# Patient Record
Sex: Female | Born: 1995
Health system: Southern US, Community
[De-identification: ages and names within clinical notes are randomized; demographics above are authoritative.]

---
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
## 2015-05-21 ENCOUNTER — Emergency Department (HOSPITAL_COMMUNITY)
Admission: EM | Admit: 2015-05-21 | Discharge: 2015-05-21 | Disposition: A | Discharge status: Home / Self Care | Payer: Self-pay | Attending: Emergency Medicine | Admitting: Emergency Medicine

## 2015-05-21 ENCOUNTER — Encounter (HOSPITAL_COMMUNITY): Payer: Self-pay | Admitting: Emergency Medicine

## 2015-05-21 DIAGNOSIS — H109 Unspecified conjunctivitis: Secondary | ICD-10-CM

## 2015-05-21 MED ORDER — ERYTHROMYCIN 5 MG/GM OP OINT: TOPICAL_OINTMENT | OPHTHALMIC | Status: AC

## 2015-05-21 MED ORDER — TETRACAINE HCL 0.5 % OP SOLN
2.0000 [drp] | Freq: Once | OPHTHALMIC | Status: AC
  Administered 2015-05-21: 2 [drp] via OPHTHALMIC
  Filled 2015-05-21: qty 2

## 2015-05-21 MED ORDER — FLUORESCEIN SODIUM 1 MG OP STRP
1.0000 | ORAL_STRIP | Freq: Once | OPHTHALMIC | Status: AC
  Administered 2015-05-21: 1 via OPHTHALMIC
  Filled 2015-05-21: qty 1

## 2015-05-21 NOTE — Discharge Instructions (Signed)
Apply a bead of antibiotic ointment to your lower eyelid every 6 hrs while awake for 5 days.  Discontinue use after 3 days if no improvement and follow up with eye specialist.    Conjunctivitis Conjunctivitis is commonly called "pink eye." Conjunctivitis can be caused by bacterial or viral infection, allergies, or injuries. There is usually redness of the lining of the eye, itching, discomfort, and sometimes discharge. There may be deposits of matter along the eyelids. A viral infection usually causes a watery discharge, while a bacterial infection causes a yellowish, thick discharge. Pink eye is very contagious and spreads by direct contact. You may be given antibiotic eyedrops as part of your treatment. Before using your eye medicine, remove all drainage from the eye by washing gently with warm water and cotton balls. Continue to use the medication until you have awakened 2 mornings in a row without discharge from the eye. Do not rub your eye. This increases the irritation and helps spread infection. Use separate towels from other household members. Wash your hands with soap and water before and after touching your eyes. Use cold compresses to reduce pain and sunglasses to relieve irritation from light. Do not wear contact lenses or wear eye makeup until the infection is gone. SEEK MEDICAL CARE IF:   Your symptoms are not better after 3 days of treatment.  You have increased pain or trouble seeing.  The outer eyelids become very red or swollen. Document Released: 01/10/2005 Document Revised: 02/25/2012 Document Reviewed: 12/03/2005 [facility] Patient Information 2015 [employer], [place]. This information is not intended to replace advice given to you by your health care provider. Make sure you discuss any questions you have with your health care provider.

## 2015-05-21 NOTE — ED Provider Notes (Signed)
CSN: [id number]     Arrival date & time 05/21/15  1049 History  This chart was scribed for non-physician practitioner, Fayrene Helper, PA-C working with Eber Hong, MD by Doreatha Martin, ED scribe. This patient was seen in room WTR5/WTR5 and the patient's care was started at 12:05 PM  Chief Complaint  Patient presents with  . Eye Drainage   The history is provided by the patient. No language interpreter was used.    HPI Comments: Holly Shannon is a 19 y.o. female who presents to the Emergency Department complaining of yellow discharge on the left eye onset 2 days PTA. She notes crusting over the eye in the morning, erythema and itching as associated Sx. She also notes an episode of mild blurry vision yesterday. Pt states she had a recent episode of pink eye in April and was given Cipro. She notes that the discharge started out white and has recently become yellow. The pt works at [employer]. Pt has tried OTC pink eye treatment with moderate relief.  She denies Hx of sickle cell, recent contact lens use and sick contact. She also denies eye pain, rhinorrhea, fever, chills, cough, double vision, loss of vision, and otalgia.    History reviewed. No pertinent past medical history. History reviewed. No pertinent past surgical history. History reviewed. No pertinent family history. History  Substance Use Topics  . Smoking status: Never Smoker   . Smokeless tobacco: Not on file  . Alcohol Use: No   OB History    No data available     Review of Systems  Constitutional: Negative for fever and chills.  HENT: Negative for ear pain and rhinorrhea.   Eyes: Positive for redness and itching.  Respiratory: Negative for cough.       Allergies  Review of patient's allergies indicates no known allergies.  Home Medications   Prior to Admission medications   Not on File   BP 103/69 mmHg  Pulse 99  Temp(Src) 98.1 F (36.7 C) (Oral)  Resp 17  SpO2 99% Physical Exam  Constitutional: She is  oriented to person, place, and time. She appears well-developed and well-nourished. No distress.  HENT:  Head: Normocephalic and atraumatic.  Right Ear: External ear normal.  Left Ear: External ear normal.  Mouth/Throat: Oropharynx is clear and moist.  No changes in visual acuity. TM intact. No sinus pain. No adenopathy. Uvula midline.    Eyes: EOM are normal. Pupils are equal, round, and reactive to light.  Slit lamp exam:      The left eye shows no corneal abrasion, no corneal flare, no corneal ulcer, no foreign body, no hyphema, no hypopyon and no fluorescein uptake.  Left eye conjunctiva mild injection. Limbic sparing. Lids everted, No foreign object. No evidence of chalazion or stye.   R eye with normal appearance  Visual Acuity - Bilateral Distance: 20/20 ; R Distance: 20/20 ; L Distance: 20/15  Neck: Normal range of motion. Neck supple. No tracheal deviation present.  Cardiovascular: Normal rate, regular rhythm and normal heart sounds.  Exam reveals no gallop and no friction rub.   No murmur heard. Pulmonary/Chest: Effort normal and breath sounds normal. No respiratory distress.  Abdominal: Soft. She exhibits no distension. There is no tenderness. There is no rebound and no guarding.  Musculoskeletal: Normal range of motion. She exhibits no edema or tenderness.  Neurological: She is alert and oriented to person, place, and time.  Skin: Skin is warm and dry.  Psychiatric: She has a  normal mood and affect. Her behavior is normal.  Nursing note and vitals reviewed.   ED Course  Procedures (including critical care time) DIAGNOSTIC STUDIES: Oxygen Saturation is 99% on RA, normal by my interpretation.    COORDINATION OF CARE: 12:13 PM Discussed treatment plan with pt at bedside which includes  and pt agreed to plan.   Patient with mild injected left eye. No significant finding on exam concerning for ocular emergency. Suspect viral etiology however I will provide erythromycin  ointment to use if symptoms worsen. Her visual acuity is normal. Patient can follow-up with ophthalmology as needed. She did report using contact lens in the past but has not been using for the past month. I do not see any evidence of corneal ulcerations or dendritic lesion on exam.  Labs Review Labs Reviewed - No data to display  Imaging Review No results found.   EKG Interpretation None      MDM   Final diagnoses:  Conjunctivitis, left eye    BP 103/69 mmHg  Pulse 99  Temp(Src) 98.1 F (36.7 C) (Oral)  Resp 17  SpO2 99%  I personally performed the services described in this documentation, which was scribed in my presence. The recorded information has been reviewed and is accurate.    Fayrene HelperBowie Nina Hoar, PA-C 05/21/15 1258  Eber HongBrian Miller, MD 05/21/15 [phone]

## 2015-05-21 NOTE — ED Notes (Signed)
Pt reports L eye redness and purulent drainage.

## 2015-05-30 ENCOUNTER — Ambulatory Visit: Payer: Self-pay

## 2019-12-21 DIAGNOSIS — Z0000 Encounter for general adult medical examination without abnormal findings: Secondary | ICD-10-CM | POA: Diagnosis not present

## 2019-12-21 DIAGNOSIS — J45990 Exercise induced bronchospasm: Secondary | ICD-10-CM | POA: Diagnosis not present

## 2019-12-21 DIAGNOSIS — Z3041 Encounter for surveillance of contraceptive pills: Secondary | ICD-10-CM | POA: Diagnosis not present

## 2020-03-29 DIAGNOSIS — J301 Allergic rhinitis due to pollen: Secondary | ICD-10-CM | POA: Diagnosis not present

## 2020-03-29 DIAGNOSIS — J3489 Other specified disorders of nose and nasal sinuses: Secondary | ICD-10-CM | POA: Diagnosis not present

## 2020-05-14 ENCOUNTER — Ambulatory Visit: Payer: Self-pay | Attending: Internal Medicine

## 2020-06-30 DIAGNOSIS — F418 Other specified anxiety disorders: Secondary | ICD-10-CM | POA: Diagnosis not present

## 2020-06-30 MED FILL — busPIRone HCL 15 MG TABS: 15 | 30 days supply | Qty: 60 | Fill #0

## 2021-03-09 DIAGNOSIS — Z130 Encounter for screening for diseases of the blood and blood-forming organs and certain disorders involving the immune mechanism: Secondary | ICD-10-CM | POA: Diagnosis not present

## 2021-03-09 DIAGNOSIS — Z0000 Encounter for general adult medical examination without abnormal findings: Secondary | ICD-10-CM | POA: Diagnosis not present

## 2021-03-09 DIAGNOSIS — Z13220 Encounter for screening for lipoid disorders: Secondary | ICD-10-CM | POA: Diagnosis not present

## 2021-03-09 DIAGNOSIS — J45990 Exercise induced bronchospasm: Secondary | ICD-10-CM | POA: Diagnosis not present

## 2021-03-09 DIAGNOSIS — F418 Other specified anxiety disorders: Secondary | ICD-10-CM | POA: Diagnosis not present

## 2021-03-09 DIAGNOSIS — Z1389 Encounter for screening for other disorder: Secondary | ICD-10-CM | POA: Diagnosis not present

## 2021-03-09 DIAGNOSIS — Z01419 Encounter for gynecological examination (general) (routine) without abnormal findings: Secondary | ICD-10-CM | POA: Diagnosis not present

## 2021-03-09 DIAGNOSIS — Z1329 Encounter for screening for other suspected endocrine disorder: Secondary | ICD-10-CM | POA: Diagnosis not present

## 2021-03-09 DIAGNOSIS — Z13228 Encounter for screening for other metabolic disorders: Secondary | ICD-10-CM | POA: Diagnosis not present

## 2021-03-16 ENCOUNTER — Other Ambulatory Visit (HOSPITAL_COMMUNITY): Payer: Self-pay | Admitting: Physician Assistant

## 2021-03-16 MED FILL — FLUCONAZOLE 150 MG TABS: 150 | 5 days supply | Qty: 2 | Fill #0

## 2022-01-18 ENCOUNTER — Other Ambulatory Visit (HOSPITAL_COMMUNITY): Payer: Self-pay

## 2022-01-18 MED ORDER — LEVONORGESTREL-ETHINYL ESTRAD 0.1-20 MG-MCG PO TABS
ORAL_TABLET | ORAL | 5 refills | Status: AC
  Filled 2022-01-18: qty 84, 84d supply, fill #0

## 2022-01-20 ENCOUNTER — Other Ambulatory Visit (HOSPITAL_COMMUNITY): Payer: Self-pay

## 2022-04-16 ENCOUNTER — Other Ambulatory Visit (HOSPITAL_COMMUNITY): Payer: Self-pay

## 2022-04-16 MED ORDER — ALBUTEROL SULFATE HFA 108 (90 BASE) MCG/ACT IN AERS
INHALATION_SPRAY | RESPIRATORY_TRACT | 1 refills | Status: AC
  Filled 2022-04-16: qty 6.7, 25d supply, fill #0

## 2022-04-16 MED ORDER — LEVONORGESTREL-ETHINYL ESTRAD 0.1-20 MG-MCG PO TABS
ORAL_TABLET | ORAL | 5 refills | Status: AC
  Filled 2022-04-16: qty 84, 84d supply, fill #0
  Filled 2022-07-04: qty 84, 84d supply, fill #1
  Filled 2022-08-23: qty 84, 84d supply, fill #2

## 2022-07-04 ENCOUNTER — Other Ambulatory Visit (HOSPITAL_COMMUNITY): Payer: Self-pay

## 2022-08-23 ENCOUNTER — Other Ambulatory Visit (HOSPITAL_COMMUNITY): Payer: Self-pay

## 2022-12-31 ENCOUNTER — Other Ambulatory Visit (HOSPITAL_COMMUNITY): Payer: Self-pay

## 2022-12-31 MED ORDER — ALBUTEROL SULFATE HFA 108 (90 BASE) MCG/ACT IN AERS
2.0000 | INHALATION_SPRAY | Freq: Four times a day (QID) | RESPIRATORY_TRACT | 1 refills | Status: AC | PRN
  Filled 2022-12-31: qty 6.7, 25d supply, fill #0
  Filled 2023-03-15: qty 6.7, 17d supply, fill #0

## 2022-12-31 MED ORDER — LEVONORGESTREL-ETHINYL ESTRAD 0.1-20 MG-MCG PO TABS
1.0000 | ORAL_TABLET | Freq: Every day | ORAL | 5 refills | Status: AC
  Filled 2022-12-31: qty 84, 84d supply, fill #0

## 2023-01-10 ENCOUNTER — Other Ambulatory Visit (HOSPITAL_COMMUNITY): Payer: Self-pay

## 2023-03-15 ENCOUNTER — Other Ambulatory Visit (HOSPITAL_COMMUNITY): Payer: Self-pay

## 2023-03-26 ENCOUNTER — Other Ambulatory Visit (HOSPITAL_COMMUNITY): Payer: Self-pay

## 2023-03-26 MED ORDER — LEVONORGESTREL-ETHINYL ESTRAD 0.1-20 MG-MCG PO TABS
1.0000 | ORAL_TABLET | Freq: Every day | ORAL | 5 refills | Status: AC
  Filled 2023-03-26: qty 84, 84d supply, fill #0

## 2023-04-18 ENCOUNTER — Other Ambulatory Visit (HOSPITAL_COMMUNITY): Payer: Self-pay

## 2023-04-18 DIAGNOSIS — Z0000 Encounter for general adult medical examination without abnormal findings: Secondary | ICD-10-CM | POA: Diagnosis not present

## 2023-04-18 DIAGNOSIS — Z87898 Personal history of other specified conditions: Secondary | ICD-10-CM | POA: Diagnosis not present

## 2023-04-18 DIAGNOSIS — Z130 Encounter for screening for diseases of the blood and blood-forming organs and certain disorders involving the immune mechanism: Secondary | ICD-10-CM | POA: Diagnosis not present

## 2023-04-18 DIAGNOSIS — F418 Other specified anxiety disorders: Secondary | ICD-10-CM | POA: Diagnosis not present

## 2023-04-18 DIAGNOSIS — J45990 Exercise induced bronchospasm: Secondary | ICD-10-CM | POA: Diagnosis not present

## 2023-04-18 DIAGNOSIS — Z13220 Encounter for screening for lipoid disorders: Secondary | ICD-10-CM | POA: Diagnosis not present

## 2023-04-18 DIAGNOSIS — Z3041 Encounter for surveillance of contraceptive pills: Secondary | ICD-10-CM | POA: Diagnosis not present

## 2023-04-18 DIAGNOSIS — Z13228 Encounter for screening for other metabolic disorders: Secondary | ICD-10-CM | POA: Diagnosis not present

## 2023-04-18 DIAGNOSIS — Z1329 Encounter for screening for other suspected endocrine disorder: Secondary | ICD-10-CM | POA: Diagnosis not present

## 2023-04-18 DIAGNOSIS — Z1331 Encounter for screening for depression: Secondary | ICD-10-CM | POA: Diagnosis not present

## 2023-04-18 MED ORDER — ONDANSETRON 4 MG PO TBDP
4.0000 mg | ORAL_TABLET | Freq: Three times a day (TID) | ORAL | 0 refills | Status: AC | PRN
  Filled 2023-04-18 – 2023-06-18 (×2): qty 20, 7d supply, fill #0

## 2023-04-18 MED ORDER — ALBUTEROL SULFATE HFA 108 (90 BASE) MCG/ACT IN AERS
2.0000 | INHALATION_SPRAY | Freq: Four times a day (QID) | RESPIRATORY_TRACT | 1 refills | Status: AC | PRN
  Filled 2023-04-18: qty 6.7, 25d supply, fill #0

## 2023-04-18 MED ORDER — SCOPOLAMINE 1 MG/3DAYS TD PT72
MEDICATED_PATCH | TRANSDERMAL | 1 refills | Status: AC
  Filled 2023-04-18 – 2023-06-18 (×2): qty 4, 12d supply, fill #0

## 2023-04-26 ENCOUNTER — Other Ambulatory Visit (HOSPITAL_COMMUNITY): Payer: Self-pay

## 2023-06-18 ENCOUNTER — Other Ambulatory Visit (HOSPITAL_COMMUNITY): Payer: Self-pay

## 2023-06-18 MED ORDER — LEVONORGESTREL-ETHINYL ESTRAD 0.1-20 MG-MCG PO TABS
1.0000 | ORAL_TABLET | Freq: Every day | ORAL | 5 refills | Status: AC
  Filled 2023-06-18: qty 84, 84d supply, fill #0

## 2023-08-05 DIAGNOSIS — Z13228 Encounter for screening for other metabolic disorders: Secondary | ICD-10-CM | POA: Diagnosis not present

## 2023-08-05 DIAGNOSIS — Z1329 Encounter for screening for other suspected endocrine disorder: Secondary | ICD-10-CM | POA: Diagnosis not present

## 2023-08-05 DIAGNOSIS — Z111 Encounter for screening for respiratory tuberculosis: Secondary | ICD-10-CM | POA: Diagnosis not present

## 2023-08-05 DIAGNOSIS — Z87898 Personal history of other specified conditions: Secondary | ICD-10-CM | POA: Diagnosis not present

## 2023-08-05 DIAGNOSIS — Z0000 Encounter for general adult medical examination without abnormal findings: Secondary | ICD-10-CM | POA: Diagnosis not present

## 2023-08-05 DIAGNOSIS — Z130 Encounter for screening for diseases of the blood and blood-forming organs and certain disorders involving the immune mechanism: Secondary | ICD-10-CM | POA: Diagnosis not present

## 2023-08-05 DIAGNOSIS — Z13220 Encounter for screening for lipoid disorders: Secondary | ICD-10-CM | POA: Diagnosis not present

## 2023-09-20 ENCOUNTER — Other Ambulatory Visit (HOSPITAL_COMMUNITY): Payer: Self-pay

## 2023-09-20 MED ORDER — LEVONORGESTREL-ETHINYL ESTRAD 0.1-20 MG-MCG PO TABS
ORAL_TABLET | ORAL | 5 refills | Status: AC
  Filled 2023-09-20: qty 84, 84d supply, fill #0

## 2023-09-20 MED ORDER — ALBUTEROL SULFATE HFA 108 (90 BASE) MCG/ACT IN AERS
INHALATION_SPRAY | RESPIRATORY_TRACT | 1 refills | Status: AC
  Filled 2023-09-20: qty 20.1, 75d supply, fill #0
  Filled 2023-09-20: qty 6.7, 20d supply, fill #0

## 2023-09-26 ENCOUNTER — Other Ambulatory Visit (HOSPITAL_COMMUNITY): Payer: Self-pay

## 2023-12-27 ENCOUNTER — Other Ambulatory Visit (HOSPITAL_COMMUNITY): Payer: Self-pay

## 2023-12-27 ENCOUNTER — Other Ambulatory Visit: Payer: Self-pay

## 2023-12-27 MED ORDER — LEVONORGESTREL-ETHINYL ESTRAD 0.1-20 MG-MCG PO TABS
1.0000 | ORAL_TABLET | Freq: Every day | ORAL | 1 refills | Status: AC
  Filled 2023-12-27: qty 84, 84d supply, fill #0

## 2023-12-30 ENCOUNTER — Other Ambulatory Visit: Payer: Self-pay

## 2023-12-31 ENCOUNTER — Other Ambulatory Visit (HOSPITAL_BASED_OUTPATIENT_CLINIC_OR_DEPARTMENT_OTHER): Payer: Self-pay

## 2023-12-31 ENCOUNTER — Other Ambulatory Visit (HOSPITAL_COMMUNITY): Payer: Self-pay

## 2023-12-31 MED ORDER — LEVONORGESTREL-ETHINYL ESTRAD 0.1-20 MG-MCG PO TABS
1.0000 | ORAL_TABLET | Freq: Every day | ORAL | 1 refills | Status: AC
  Filled 2023-12-31: qty 84, 84d supply, fill #0

## 2024-01-01 ENCOUNTER — Other Ambulatory Visit (HOSPITAL_COMMUNITY): Payer: Self-pay

## 2024-01-02 ENCOUNTER — Other Ambulatory Visit (HOSPITAL_COMMUNITY): Payer: Self-pay

## 2024-03-24 ENCOUNTER — Other Ambulatory Visit (HOSPITAL_COMMUNITY): Payer: Self-pay

## 2024-03-24 DIAGNOSIS — Z13228 Encounter for screening for other metabolic disorders: Secondary | ICD-10-CM | POA: Diagnosis not present

## 2024-03-24 DIAGNOSIS — Z1329 Encounter for screening for other suspected endocrine disorder: Secondary | ICD-10-CM | POA: Diagnosis not present

## 2024-03-24 DIAGNOSIS — Z01419 Encounter for gynecological examination (general) (routine) without abnormal findings: Secondary | ICD-10-CM | POA: Diagnosis not present

## 2024-03-24 DIAGNOSIS — Z13220 Encounter for screening for lipoid disorders: Secondary | ICD-10-CM | POA: Diagnosis not present

## 2024-03-24 MED ORDER — ALBUTEROL SULFATE HFA 108 (90 BASE) MCG/ACT IN AERS
2.0000 | INHALATION_SPRAY | Freq: Four times a day (QID) | RESPIRATORY_TRACT | 1 refills | Status: AC | PRN
  Filled 2024-03-24: qty 6.7, 17d supply, fill #0

## 2024-03-24 MED ORDER — LEVONORGESTREL-ETHINYL ESTRAD 0.1-20 MG-MCG PO TABS
1.0000 | ORAL_TABLET | Freq: Every day | ORAL | 1 refills | Status: AC
  Filled 2024-03-24: qty 84, 84d supply, fill #0

## 2024-03-27 ENCOUNTER — Other Ambulatory Visit (HOSPITAL_COMMUNITY): Payer: Self-pay

## 2024-03-27 MED ORDER — FLUCONAZOLE 150 MG PO TABS
150.0000 mg | ORAL_TABLET | Freq: Once | ORAL | 0 refills | Status: AC
  Filled 2024-03-27: qty 1, 1d supply, fill #0

## 2024-04-27 ENCOUNTER — Encounter: Payer: Self-pay | Admitting: Emergency Medicine

## 2024-04-27 ENCOUNTER — Other Ambulatory Visit: Payer: Self-pay

## 2024-04-27 ENCOUNTER — Ambulatory Visit
Admission: EM | Admit: 2024-04-27 | Discharge: 2024-04-27 | Disposition: A | Discharge status: Home / Self Care | Attending: Emergency Medicine | Admitting: Emergency Medicine

## 2024-04-27 DIAGNOSIS — J069 Acute upper respiratory infection, unspecified: Secondary | ICD-10-CM | POA: Diagnosis not present

## 2024-04-27 DIAGNOSIS — J45901 Unspecified asthma with (acute) exacerbation: Secondary | ICD-10-CM | POA: Diagnosis not present

## 2024-04-27 MED ORDER — IPRATROPIUM-ALBUTEROL 0.5-2.5 (3) MG/3ML IN SOLN
3.0000 mL | Freq: Once | RESPIRATORY_TRACT | Status: AC
  Administered 2024-04-27: 3 mL via RESPIRATORY_TRACT

## 2024-04-27 MED ORDER — PREDNISONE 20 MG PO TABS: 40.0000 mg | ORAL_TABLET | Freq: Every day | ORAL | 0 refills | Status: AC

## 2024-04-27 NOTE — Discharge Instructions (Addendum)
 Start the steroids today and then take them daily with breakfast.  You can use your albuterol  inhaler every 6 hours as needed for wheezing or shortness of breath.  Ensure you are staying well-hydrated, drinking at least 64 ounces of water, and sleeping with a humidifier may help to loosen secretions.  Consider over-the-counter saline nasal rinses with filtered water.  1200 mg of Mucinex daily can also help loosen secretions.  Return to clinic for any new or urgent symptoms.

## 2024-04-27 NOTE — ED Triage Notes (Signed)
 Pt c/o nasal congestion, cough and sneezing for the past 3 days.

## 2024-04-27 NOTE — ED Provider Notes (Signed)
 [facility]    CSN: [id number] Arrival date & time: 04/27/24  1119      History   Chief Complaint Chief Complaint  Patient presents with   URI    HPI Holly Shannon is a 28 y.o. female.   Patient presents to clinic over concerns of nasal congestion, cough, and sneezing that have been ongoing for the past 3 days.  She has tried Mucinex, Sudafed and other over-the-counter therapies without much improvement.  This morning she woke up short of breath and used her albuterol  inhaler.  Continues to feel short of breath and like she is wheezing.  Does have a history of asthma.  Has not had any fevers that she knows of.  No recent sick contacts.  Without nausea, vomiting or diarrhea.  The history is provided by the patient and medical records.  URI   History reviewed. No pertinent past medical history.  There are no active problems to display for this patient.   History reviewed. No pertinent surgical history.  OB History   No obstetric history on file.      Home Medications    Prior to Admission medications   Medication Sig Start Date End Date Taking? Authorizing Provider  predniSONE (DELTASONE) 20 MG tablet Take 2 tablets (40 mg total) by mouth daily with breakfast for 5 days. 04/27/24 05/02/24 Yes Jossette Zirbel  N, FNP  albuterol  (VENTOLIN  HFA) 108 (90 Base) MCG/ACT inhaler Inhale 2 puffs into the lungs every 6 hours as needed for wheezing or shortness of breath. 04/16/22     albuterol  (VENTOLIN  HFA) 108 (90 Base) MCG/ACT inhaler Inhale 2 puffs into the lungs every 6 (six) hours as needed for wheezing or shortness of breath. 12/31/22     albuterol  (VENTOLIN  HFA) 108 (90 Base) MCG/ACT inhaler Inhale 2 puffs into the lungs every 6 (six) hours as needed for wheezing or shortness of breath. 04/18/23     albuterol  (VENTOLIN  HFA) 108 (90 Base) MCG/ACT inhaler Inhale two puffs into the lungs every 6 (six) hours as needed for Wheezing or Shortness of Breath. 09/20/23      albuterol  (VENTOLIN  HFA) 108 (90 Base) MCG/ACT inhaler Inhale 2 puffs into the lungs every 6 (six) hours as needed for shortness of breath or wheezing 03/24/24     erythromycin  ophthalmic ointment Place a 1/2 inch ribbon of ointment into the lower eyelid every 6 hrs while awake for the next 5 days 05/21/15   Debbra Fairy, PA-C  levonorgestrel -ethinyl estradiol  (ALESSE) 0.1-20 MG-MCG tablet TAKE 1 TABLET BY MOUTH DAILY AS DIRECTED 01/18/22     levonorgestrel -ethinyl estradiol  (ALESSE) 0.1-20 MG-MCG tablet TAKE 1 TABLET BY MOUTH DAILY AS DIRECTED 04/16/22     levonorgestrel -ethinyl estradiol  (ALESSE) 0.1-20 MG-MCG tablet Take 1 tablet by mouth daily as directed 12/31/22     levonorgestrel -ethinyl estradiol  (ALESSE) 0.1-20 MG-MCG tablet Take 1 tablet by mouth daily as directed 03/26/23     levonorgestrel -ethinyl estradiol  (ALESSE) 0.1-20 MG-MCG tablet Take 1 tablet by mouth daily. 06/18/23     levonorgestrel -ethinyl estradiol  (ALESSE) 0.1-20 MG-MCG tablet TAKE 1 TABLET BY MOUTH DAILY AS DIRECTED 09/20/23     levonorgestrel -ethinyl estradiol  (ALESSE) 0.1-20 MG-MCG tablet Take 1 tablet by mouth daily. 12/27/23     levonorgestrel -ethinyl estradiol  (ALESSE) 0.1-20 MG-MCG tablet Take 1 tablet by mouth daily. 12/31/23     levonorgestrel -ethinyl estradiol  (ALESSE) 0.1-20 MG-MCG tablet Take 1 tablet by mouth daily. 03/24/24     levonorgestrel -ethinyl estradiol  (AVIANE,ALESSE,LESSINA ) 0.1-20 MG-MCG tablet Take 1 tablet by mouth daily.  [provider]  ondansetron  (ZOFRAN -ODT) 4 MG disintegrating tablet Dissolve 1 tablet (4 mg) by mouth every 8 hours as needed for nausea 04/18/23     scopolamine  (TRANSDERM-SCOP) 1 MG/3DAYS Place 1 patch onto the skin as directed every third day. 04/18/23       Family History Family History  Problem Relation Age of Onset   Healthy Neg Hx    Rheum arthritis Neg Hx    Cancer Neg Hx    Seizures Neg Hx    Heart failure Neg Hx    Hyperlipidemia Neg Hx    Diabetes Neg Hx    Asthma Neg  Hx    Migraines Neg Hx     Social History Social History   Tobacco Use   Smoking status: Never  Substance Use Topics   Alcohol use: No   Drug use: No     Allergies   Almond meal (obsolete)   Review of Systems Review of Systems  Per HPI  Physical Exam Triage Vital Signs ED Triage Vitals  Encounter Vitals Group     BP 04/27/24 1129 (!) 136/93     Systolic BP Percentile --      Diastolic BP Percentile --      Pulse Rate 04/27/24 1129 90     Resp 04/27/24 1129 16     Temp 04/27/24 1129 98.3 F (36.8 C)     Temp Source 04/27/24 1129 Oral     SpO2 04/27/24 1129 97 %     Weight 04/27/24 1129 128 lb (58.1 kg)     Height 04/27/24 1129 5' 1.75" (1.568 m)     Head Circumference --      Peak Flow --      Pain Score 04/27/24 1135 0     Pain Loc --      Pain Education --      Exclude from Growth Chart --    No data found.  Updated Vital Signs BP (!) 136/93 (BP Location: Right Arm) Comment: Pt states her BP is not normally high but she is anxious  Pulse 90   Temp 98.3 F (36.8 C) (Oral)   Resp 16   Ht 5' 1.75" (1.568 m)   Wt 128 lb (58.1 kg)   LMP 04/15/2024 (Approximate)   SpO2 97%   BMI 23.60 kg/m   Visual Acuity Right Eye Distance:   Left Eye Distance:   Bilateral Distance:    Right Eye Near:   Left Eye Near:    Bilateral Near:     Physical Exam Vitals and nursing note reviewed.  Constitutional:      Appearance: Normal appearance.  HENT:     Head: Normocephalic and atraumatic.     Right Ear: External ear normal.     Left Ear: External ear normal.     Nose: Congestion and rhinorrhea present.     Mouth/Throat:     Mouth: Mucous membranes are moist.  Eyes:     Conjunctiva/sclera: Conjunctivae normal.  Cardiovascular:     Rate and Rhythm: Normal rate and regular rhythm.     Heart sounds: Normal heart sounds. No murmur heard. Pulmonary:     Effort: Pulmonary effort is normal.     Breath sounds: Wheezing present.  Musculoskeletal:         General: Normal range of motion.  Skin:    General: Skin is warm and dry.  Neurological:     General: No focal deficit present.     Mental Status:  She is alert.  Psychiatric:        Mood and Affect: Mood normal.      UC Treatments / Results  Labs (all labs ordered are listed, but only abnormal results are displayed) Labs Reviewed - No data to display  EKG   Radiology No results found.  Procedures Procedures (including critical care time)  Medications Ordered in UC Medications  ipratropium-albuterol  (DUONEB) 0.5-2.5 (3) MG/3ML nebulizer solution 3 mL (3 mLs Nebulization Given 04/27/24 1155)    Initial Impression / Assessment and Plan / UC Course  I have reviewed the triage vital signs and the nursing notes.  Pertinent labs & imaging results that were available during my care of the patient were reviewed by me and considered in my medical decision making (see chart for details).  Vitals in triage reviewed, patient is hemodynamically stable.  Congestion, rhinorrhea and postnasal drip present on physical exam.  Lungs with expiratory wheezing throughout.  Air movement much improved after DuoNeb.  Will treat for asthma exacerbation, most likely in setting of viral URI, with steroid burst.  Plan of care, follow-up care return precautions given, no questions at this time.     Final Clinical Impressions(s) / UC Diagnoses   Final diagnoses:  Asthma with acute exacerbation, unspecified asthma severity, unspecified whether persistent  Viral URI with cough     Discharge Instructions      Start the steroids today and then take them daily with breakfast.  You can use your albuterol  inhaler every 6 hours as needed for wheezing or shortness of breath.  Ensure you are staying well-hydrated, drinking at least 64 ounces of water, and sleeping with a humidifier may help to loosen secretions.  Consider over-the-counter saline nasal rinses with filtered water.  1200 mg of Mucinex daily  can also help loosen secretions.  Return to clinic for any new or urgent symptoms.    ED Prescriptions     Medication Sig Dispense Auth. Provider   predniSONE (DELTASONE) 20 MG tablet Take 2 tablets (40 mg total) by mouth daily with breakfast for 5 days. 10 tablet Harlow Lighter, Antonieta Slaven  N, FNP      PDMP not reviewed this encounter.   Lenore Rafter, FNP 04/27/24 1220

## 2024-05-14 ENCOUNTER — Ambulatory Visit
Admission: EM | Admit: 2024-05-14 | Discharge: 2024-05-14 | Disposition: A | Discharge status: Home / Self Care | Attending: Emergency Medicine | Admitting: Emergency Medicine

## 2024-05-14 ENCOUNTER — Emergency Department (HOSPITAL_BASED_OUTPATIENT_CLINIC_OR_DEPARTMENT_OTHER)
Admission: EM | Admit: 2024-05-14 | Discharge: 2024-05-14 | Discharge status: Against Medical Advice | Attending: Emergency Medicine | Admitting: Emergency Medicine

## 2024-05-14 ENCOUNTER — Encounter: Payer: Self-pay | Admitting: Emergency Medicine

## 2024-05-14 ENCOUNTER — Other Ambulatory Visit: Payer: Self-pay

## 2024-05-14 ENCOUNTER — Encounter (HOSPITAL_BASED_OUTPATIENT_CLINIC_OR_DEPARTMENT_OTHER): Payer: Self-pay

## 2024-05-14 DIAGNOSIS — Z5321 Procedure and treatment not carried out due to patient leaving prior to being seen by health care provider: Secondary | ICD-10-CM | POA: Insufficient documentation

## 2024-05-14 DIAGNOSIS — R110 Nausea: Secondary | ICD-10-CM | POA: Insufficient documentation

## 2024-05-14 DIAGNOSIS — R109 Unspecified abdominal pain: Secondary | ICD-10-CM

## 2024-05-14 LAB — CBC
HCT: 40.1 % (ref 36.0–46.0)
Hemoglobin: 13.8 g/dL (ref 12.0–15.0)
MCH: 31.3 pg (ref 26.0–34.0)
MCHC: 34.4 g/dL (ref 30.0–36.0)
MCV: 90.9 fL (ref 80.0–100.0)
Platelets: 270 10*3/uL (ref 150–400)
RBC: 4.41 MIL/uL (ref 3.87–5.11)
RDW: 12.4 % (ref 11.5–15.5)
WBC: 6.1 10*3/uL (ref 4.0–10.5)
nRBC: 0 % (ref 0.0–0.2)

## 2024-05-14 LAB — URINALYSIS, ROUTINE W REFLEX MICROSCOPIC
Bilirubin Urine: NEGATIVE
Glucose, UA: NEGATIVE mg/dL
Hgb urine dipstick: NEGATIVE
Ketones, ur: 15 mg/dL — AB
Leukocytes,Ua: NEGATIVE
Nitrite: NEGATIVE
Protein, ur: NEGATIVE mg/dL
Specific Gravity, Urine: 1.01 (ref 1.005–1.030)
pH: 6 (ref 5.0–8.0)

## 2024-05-14 LAB — COMPREHENSIVE METABOLIC PANEL WITH GFR
ALT: 19 U/L (ref 0–44)
AST: 24 U/L (ref 15–41)
Albumin: 4.5 g/dL (ref 3.5–5.0)
Alkaline Phosphatase: 46 U/L (ref 38–126)
Anion gap: 16 — ABNORMAL HIGH (ref 5–15)
BUN: 11 mg/dL (ref 6–20)
CO2: 21 mmol/L — ABNORMAL LOW (ref 22–32)
Calcium: 10 mg/dL (ref 8.9–10.3)
Chloride: 101 mmol/L (ref 98–111)
Creatinine, Ser: 0.68 mg/dL (ref 0.44–1.00)
GFR, Estimated: >60 mL/min (ref >60)
Glucose, Bld: 89 mg/dL (ref 70–99)
Potassium: 4 mmol/L (ref 3.5–5.1)
Sodium: 138 mmol/L (ref 135–145)
Total Bilirubin: 0.4 mg/dL (ref 0.0–1.2)
Total Protein: 8.2 g/dL — ABNORMAL HIGH (ref 6.5–8.1)

## 2024-05-14 LAB — POCT URINALYSIS DIP (MANUAL ENTRY)
Bilirubin, UA: NEGATIVE
Blood, UA: NEGATIVE
Glucose, UA: NEGATIVE mg/dL
Ketones, POC UA: NEGATIVE mg/dL
Leukocytes, UA: NEGATIVE
Nitrite, UA: NEGATIVE
Protein Ur, POC: 30 mg/dL — AB
Spec Grav, UA: 1.025
Urobilinogen, UA: 0.2 U/dL
pH, UA: 7.5

## 2024-05-14 LAB — PREGNANCY, URINE: Preg Test, Ur: NEGATIVE

## 2024-05-14 LAB — LIPASE, BLOOD: Lipase: 22 U/L (ref 11–51)

## 2024-05-14 LAB — POCT URINE PREGNANCY: Preg Test, Ur: NEGATIVE

## 2024-05-14 MED ORDER — ONDANSETRON 4 MG PO TBDP
4.0000 mg | ORAL_TABLET | Freq: Once | ORAL | Status: AC | PRN
  Administered 2024-05-14: 4 mg via ORAL
  Filled 2024-05-14: qty 1

## 2024-05-14 NOTE — ED Notes (Addendum)
Discard note

## 2024-05-14 NOTE — ED Provider Notes (Signed)
 [facility]    CSN: [id number] Arrival date & time: 05/14/24  1755      History   Chief Complaint Chief Complaint  Patient presents with   Abdominal Pain    HPI Holly Shannon is a 28 y.o. female.   Patient presents to clinic over concern of central abdominal pain that woke her up from a nap.  She woke up and was doubled over in pain.  Pain is intermittent but is sharp and stabbing when it comes.  Feels it at the central abdominal area.  Has not had any fever.  A little nausea, no vomiting.  Normal bowel movement today.  The history is provided by the patient and medical records.  Abdominal Pain   History reviewed. No pertinent past medical history.  There are no active problems to display for this patient.   History reviewed. No pertinent surgical history.  OB History   No obstetric history on file.      Home Medications    Prior to Admission medications   Medication Sig Start Date End Date Taking? Authorizing Provider  albuterol  (VENTOLIN  HFA) 108 (90 Base) MCG/ACT inhaler Inhale 2 puffs into the lungs every 6 hours as needed for wheezing or shortness of breath. 04/16/22     albuterol  (VENTOLIN  HFA) 108 (90 Base) MCG/ACT inhaler Inhale 2 puffs into the lungs every 6 (six) hours as needed for wheezing or shortness of breath. 12/31/22     albuterol  (VENTOLIN  HFA) 108 (90 Base) MCG/ACT inhaler Inhale 2 puffs into the lungs every 6 (six) hours as needed for wheezing or shortness of breath. 04/18/23     albuterol  (VENTOLIN  HFA) 108 (90 Base) MCG/ACT inhaler Inhale two puffs into the lungs every 6 (six) hours as needed for Wheezing or Shortness of Breath. 09/20/23     albuterol  (VENTOLIN  HFA) 108 (90 Base) MCG/ACT inhaler Inhale 2 puffs into the lungs every 6 (six) hours as needed for shortness of breath or wheezing 03/24/24     erythromycin  ophthalmic ointment Place a 1/2 inch ribbon of ointment into the lower eyelid every 6 hrs while awake for the next 5 days 05/21/15    Debbra Fairy, PA-C  levonorgestrel -ethinyl estradiol  (ALESSE) 0.1-20 MG-MCG tablet TAKE 1 TABLET BY MOUTH DAILY AS DIRECTED 01/18/22     levonorgestrel -ethinyl estradiol  (ALESSE) 0.1-20 MG-MCG tablet TAKE 1 TABLET BY MOUTH DAILY AS DIRECTED 04/16/22     levonorgestrel -ethinyl estradiol  (ALESSE) 0.1-20 MG-MCG tablet Take 1 tablet by mouth daily as directed 12/31/22     levonorgestrel -ethinyl estradiol  (ALESSE) 0.1-20 MG-MCG tablet Take 1 tablet by mouth daily as directed 03/26/23     levonorgestrel -ethinyl estradiol  (ALESSE) 0.1-20 MG-MCG tablet Take 1 tablet by mouth daily. 06/18/23     levonorgestrel -ethinyl estradiol  (ALESSE) 0.1-20 MG-MCG tablet TAKE 1 TABLET BY MOUTH DAILY AS DIRECTED 09/20/23     levonorgestrel -ethinyl estradiol  (ALESSE) 0.1-20 MG-MCG tablet Take 1 tablet by mouth daily. 12/27/23     levonorgestrel -ethinyl estradiol  (ALESSE) 0.1-20 MG-MCG tablet Take 1 tablet by mouth daily. 12/31/23     levonorgestrel -ethinyl estradiol  (ALESSE) 0.1-20 MG-MCG tablet Take 1 tablet by mouth daily. 03/24/24     levonorgestrel -ethinyl estradiol  (AVIANE,ALESSE,LESSINA ) 0.1-20 MG-MCG tablet Take 1 tablet by mouth daily.    [provider]  ondansetron  (ZOFRAN -ODT) 4 MG disintegrating tablet Dissolve 1 tablet (4 mg) by mouth every 8 hours as needed for nausea 04/18/23     scopolamine  (TRANSDERM-SCOP) 1 MG/3DAYS Place 1 patch onto the skin as directed every third day. 04/18/23  Family History Family History  Problem Relation Age of Onset   Healthy Neg Hx    Rheum arthritis Neg Hx    Cancer Neg Hx    Seizures Neg Hx    Heart failure Neg Hx    Hyperlipidemia Neg Hx    Diabetes Neg Hx    Asthma Neg Hx    Migraines Neg Hx     Social History Social History   Tobacco Use   Smoking status: Never  Substance Use Topics   Alcohol use: No   Drug use: No     Allergies   Almond meal (obsolete)   Review of Systems Review of Systems  Per HPI  Physical Exam Triage Vital Signs ED Triage  Vitals  Encounter Vitals Group     BP 05/14/24 1813 119/71     Systolic BP Percentile --      Diastolic BP Percentile --      Pulse Rate 05/14/24 1813 (!) 117     Resp 05/14/24 1813 16     Temp 05/14/24 1813 98.3 F (36.8 C)     Temp Source 05/14/24 1813 Oral     SpO2 05/14/24 1813 99 %     Weight --      Height --      Head Circumference --      Peak Flow --      Pain Score 05/14/24 1812 9     Pain Loc --      Pain Education --      Exclude from Growth Chart --    No data found.  Updated Vital Signs BP 119/71 (BP Location: Right Arm)   Pulse (!) 117   Temp 98.3 F (36.8 C) (Oral)   Resp 16   LMP 04/01/2024 (Approximate)   SpO2 99%   Visual Acuity Right Eye Distance:   Left Eye Distance:   Bilateral Distance:    Right Eye Near:   Left Eye Near:    Bilateral Near:     Physical Exam   UC Treatments / Results  Labs (all labs ordered are listed, but only abnormal results are displayed) Labs Reviewed  POCT URINALYSIS DIP (MANUAL ENTRY) - Abnormal; Notable for the following components:      Result Value   Protein Ur, POC =30 (*)    All other components within normal limits  POCT URINE PREGNANCY - Normal    EKG   Radiology No results found.  Procedures Procedures (including critical care time)  Medications Ordered in UC Medications - No data to display  Initial Impression / Assessment and Plan / UC Course  I have reviewed the triage vital signs and the nursing notes.  Pertinent labs & imaging results that were available during my care of the patient were reviewed by me and considered in my medical decision making (see chart for details).  Vitals in triage reviewed, patient is tachycardic.  Occasionally clenching fist in clinic.  Denies urinary symptoms, UA unremarkable. Urine pregnancy negative.   Abdominal exam with active bowel sounds.  Reports umbilical and epigastric abdominal pain with palpation.  Without rebound or guarding.  Due to severity  of pain and limitations of urgent care, discussed the patient would benefit from further evaluation at the nearest emergency department.     Final Clinical Impressions(s) / UC Diagnoses   Final diagnoses:  Abdominal pain, unspecified abdominal location     Discharge Instructions      It is unclear what is causing your abdominal  pain.  If your pain gets worse, you develop fever, or any new concerning symptoms seek immediate care at the nearest emergency department for further advanced evaluation.    ED Prescriptions   None    PDMP not reviewed this encounter.   Harlow Lighter, Hazel Wrinkle  N, FNP 05/14/24 [phone]

## 2024-05-14 NOTE — ED Triage Notes (Signed)
 Pt presents with "severe abdominal pain" that began ago while she was sleeping. States it feel sharp and is at center of abdomin.

## 2024-05-14 NOTE — ED Triage Notes (Signed)
 Pt reports sharp abd pain x2 hours. Pt reports that the pain is throbbing, mid abdomen. Pt also endorses nausea.

## 2024-05-14 NOTE — Discharge Instructions (Signed)
 It is unclear what is causing your abdominal pain.  If your pain gets worse, you develop fever, or any new concerning symptoms seek immediate care at the nearest emergency department for further advanced evaluation.

## 2024-05-14 NOTE — ED Notes (Signed)
 Patient reported to nursing staff that "she had to go."  Visitor at bedside.  Patient alert and oriented.  Steady gait noted

## 2024-07-02 ENCOUNTER — Other Ambulatory Visit (HOSPITAL_COMMUNITY): Payer: Self-pay

## 2024-07-02 MED ORDER — LEVONORGESTREL-ETHINYL ESTRAD 0.1-20 MG-MCG PO TABS
1.0000 | ORAL_TABLET | Freq: Every day | ORAL | 1 refills | Status: AC
  Filled 2024-07-02: qty 84, 84d supply, fill #0

## 2024-07-29 DIAGNOSIS — Z111 Encounter for screening for respiratory tuberculosis: Secondary | ICD-10-CM | POA: Diagnosis not present

## 2024-10-09 ENCOUNTER — Other Ambulatory Visit (HOSPITAL_COMMUNITY): Payer: Self-pay

## 2024-10-09 MED ORDER — LEVONORGESTREL-ETHINYL ESTRAD 0.1-20 MG-MCG PO TABS
1.0000 | ORAL_TABLET | Freq: Every day | ORAL | 1 refills | Status: AC
  Filled 2024-10-09: qty 84, 84d supply, fill #0

## 2024-10-13 ENCOUNTER — Other Ambulatory Visit (HOSPITAL_COMMUNITY): Payer: Self-pay

## 2025-01-07 ENCOUNTER — Other Ambulatory Visit (HOSPITAL_COMMUNITY): Payer: Self-pay

## 2025-01-07 ENCOUNTER — Other Ambulatory Visit: Payer: Self-pay

## 2025-01-07 ENCOUNTER — Encounter (HOSPITAL_COMMUNITY): Payer: Self-pay

## 2025-01-07 MED ORDER — LEVONORGESTREL-ETHINYL ESTRAD 0.1-20 MG-MCG PO TABS
1.0000 | ORAL_TABLET | Freq: Every day | ORAL | 1 refills | Status: AC
  Filled 2025-01-07: qty 84, 84d supply, fill #0
